# Patient Record
Sex: Female | Born: 1995 | Hispanic: Yes | Marital: Single | Smoking: Never smoker
Health system: Southern US, Community
[De-identification: ages and names within clinical notes are randomized; demographics above are authoritative.]

---

## 2020-04-19 ENCOUNTER — Other Ambulatory Visit: Payer: Self-pay

## 2020-04-19 ENCOUNTER — Encounter: Payer: Self-pay | Admitting: Emergency Medicine

## 2020-04-19 DIAGNOSIS — Z20822 Contact with and (suspected) exposure to covid-19: Secondary | ICD-10-CM | POA: Insufficient documentation

## 2020-04-19 DIAGNOSIS — N83201 Unspecified ovarian cyst, right side: Secondary | ICD-10-CM | POA: Insufficient documentation

## 2020-04-19 DIAGNOSIS — N83202 Unspecified ovarian cyst, left side: Secondary | ICD-10-CM | POA: Insufficient documentation

## 2020-04-19 LAB — CBC
HCT: 33.5 % — ABNORMAL LOW (ref 36.0–46.0)
Hemoglobin: 9.8 g/dL — ABNORMAL LOW (ref 12.0–15.0)
MCH: 22.2 pg — ABNORMAL LOW (ref 26.0–34.0)
MCHC: 29.3 g/dL — ABNORMAL LOW (ref 30.0–36.0)
MCV: 75.8 fL — ABNORMAL LOW (ref 80.0–100.0)
Platelets: 339 10*3/uL (ref 150–400)
RBC: 4.42 MIL/uL (ref 3.87–5.11)
RDW: 16.9 % — ABNORMAL HIGH (ref 11.5–15.5)
WBC: 6 10*3/uL (ref 4.0–10.5)
nRBC: 0 % (ref 0.0–0.2)

## 2020-04-19 LAB — URINALYSIS, COMPLETE (UACMP) WITH MICROSCOPIC
Bilirubin Urine: NEGATIVE
Glucose, UA: NEGATIVE mg/dL
Hgb urine dipstick: NEGATIVE
Ketones, ur: NEGATIVE mg/dL
Leukocytes,Ua: NEGATIVE
Nitrite: NEGATIVE
Protein, ur: NEGATIVE mg/dL
Specific Gravity, Urine: 1.01 (ref 1.005–1.030)
pH: 7 (ref 5.0–8.0)

## 2020-04-19 LAB — LIPASE, BLOOD: Lipase: 35 U/L (ref 11–51)

## 2020-04-19 LAB — COMPREHENSIVE METABOLIC PANEL
ALT: 50 U/L — ABNORMAL HIGH (ref 0–44)
AST: 32 U/L (ref 15–41)
Albumin: 4.3 g/dL (ref 3.5–5.0)
Alkaline Phosphatase: 72 U/L (ref 38–126)
Anion gap: 10 (ref 5–15)
BUN: 10 mg/dL (ref 6–20)
CO2: 26 mmol/L (ref 22–32)
Calcium: 8.6 mg/dL — ABNORMAL LOW (ref 8.9–10.3)
Chloride: 103 mmol/L (ref 98–111)
Creatinine, Ser: 0.58 mg/dL (ref 0.44–1.00)
GFR calc Af Amer: >60 mL/min (ref >60)
GFR calc non Af Amer: >60 mL/min (ref >60)
Glucose, Bld: 103 mg/dL — ABNORMAL HIGH (ref 70–99)
Potassium: 3.6 mmol/L (ref 3.5–5.1)
Sodium: 139 mmol/L (ref 135–145)
Total Bilirubin: 0.5 mg/dL (ref 0.3–1.2)
Total Protein: 7.6 g/dL (ref 6.5–8.1)

## 2020-04-19 LAB — POCT PREGNANCY, URINE: Preg Test, Ur: NEGATIVE

## 2020-04-19 NOTE — ED Triage Notes (Addendum)
Pt presents to ED via EMS with c/o sudden onset at 1830 of left lower abd pain rated at 10/10. Hx of ovarian cyst and states this feels the same. Denies pregnancy. EMS vs 99 temp 133/78 with a HR of 89. Denies vomiting. Diarrhea for the past week.

## 2020-04-20 ENCOUNTER — Emergency Department: Payer: Self-pay

## 2020-04-20 ENCOUNTER — Emergency Department
Admission: EM | Admit: 2020-04-20 | Discharge: 2020-04-20 | Disposition: A | Discharge status: Home / Self Care | Payer: Self-pay | Attending: Emergency Medicine | Admitting: Emergency Medicine

## 2020-04-20 DIAGNOSIS — R1032 Left lower quadrant pain: Secondary | ICD-10-CM

## 2020-04-20 DIAGNOSIS — N83201 Unspecified ovarian cyst, right side: Secondary | ICD-10-CM

## 2020-04-20 DIAGNOSIS — R1030 Lower abdominal pain, unspecified: Secondary | ICD-10-CM

## 2020-04-20 LAB — SARS CORONAVIRUS 2 BY RT PCR (HOSPITAL ORDER, PERFORMED IN ~~LOC~~ HOSPITAL LAB): SARS Coronavirus 2: NEGATIVE

## 2020-04-20 MED ORDER — KETOROLAC TROMETHAMINE 30 MG/ML IJ SOLN
30.0000 mg | Freq: Once | INTRAMUSCULAR | Status: AC
  Administered 2020-04-20: 30 mg via INTRAMUSCULAR
  Filled 2020-04-20: qty 1

## 2020-04-20 NOTE — Discharge Instructions (Addendum)
You have been seen in the Emergency Department (ED) for abdominal pain.  We believe your pain is mostly likely caused by ovarian cysts, which is generally a benign but potentially painful process.  Please read through the included information and follow up as instructed above regarding today's emergent visit and the symptoms that are bothering you.  Take over-the-counter ibuprofen and Tylenol as needed for pain control (unless your doctor has told you in the past to avoid either of these medications).  Return to the ED if your abdominal pain worsens or fails to improve, you develop bloody vomiting, bloody diarrhea, you are unable to tolerate fluids due to vomiting, fever greater than 101, or other symptoms that concern you.

## 2020-04-20 NOTE — ED Provider Notes (Signed)
[facility] Emergency Department Provider Note  ____________________________________________   First MD Initiated Contact with Patient 04/20/20 [phone]     (approximate)  I have reviewed the triage vital signs and the nursing notes.   HISTORY  Chief Complaint Abdominal Pain  The patient and/or family speak(s) Spanish natively although she does speak some English as well. She understands she has the right to the use of a hospital interpreter, however at this time she prefers to speak directly with me in [place] and [place]. She knows that she may request the use of a hospital interpreter at any time.   HPI Michelle Lindsey is a 24 y.o. female who reports a history of irregular menstrual periods and a prior issue with pain from ovarian cyst. She presents tonight for evaluation of severe pain in her left lower quadrant that she says has been occurring on and off for the last couple of days. She is about 2 weeks late on her menstrual cycle but that is not abnormal for her. She said when the pain occurs it is sharp and stabbing and very severe. She is also had some nausea and vomiting associated with the severe pain. Nothing in particular seems to make it better or worse. Is much better right now she is not actively hurting. She has no history of kidney stones and has had no burning when she urinates or other urinary symptoms. She denies fever/chills. She reports having some "lung pain" about 3 days ago and a mild sore throat. She is not vaccinated against COVID-19. She is having no difficulty breathing and no cough.          History reviewed. No pertinent past medical history.  There are no problems to display for this patient.   History reviewed. No pertinent surgical history.  Prior to Admission medications   Not on File    Allergies Patient has no known allergies.  No family history on file.  Social History Social History   Tobacco Use  . Smoking  status: Never Smoker  . Smokeless tobacco: Never Used  Vaping Use  . Vaping Use: Never used  Substance Use Topics  . Alcohol use: Yes  . Drug use: Not Currently    Review of Systems Constitutional: No fever/chills Eyes: No visual changes. ENT: Sore throat about 3 days ago. Cardiovascular: Denies chest pain. "Lung pain" 3 days ago, now resolved. Respiratory: Denies shortness of breath. Gastrointestinal: Left lower quadrant abdominal pain associated with nausea and vomiting as described above. Genitourinary: Negative for dysuria. Musculoskeletal: Negative for neck pain.  Negative for back pain. Integumentary: Negative for rash. Neurological: Negative for headaches, focal weakness or numbness.   ____________________________________________   PHYSICAL EXAM:  VITAL SIGNS: ED Triage Vitals  Enc Vitals Group     BP 04/19/20 1934 (!) 125/95     Pulse Rate 04/19/20 1934 72     Resp 04/19/20 1934 16     Temp 04/19/20 1934 99.3 F (37.4 C)     Temp Source 04/19/20 1934 Oral     SpO2 04/19/20 1934 100 %     Weight 04/19/20 1937 67.1 kg (148 lb)     Height 04/19/20 1937 1.448 m (4\' 9" )     Head Circumference --      Peak Flow --      Pain Score 04/19/20 1936 10     Pain Loc --      Pain Edu? --      Excl. in GC? --  Constitutional: Alert and oriented.  Eyes: Conjunctivae are normal.  Head: Atraumatic. Nose: No congestion/rhinnorhea. Mouth/Throat: Patient is wearing a mask. Neck: No stridor.  No meningeal signs.   Cardiovascular: Normal rate, regular rhythm. Good peripheral circulation. Grossly normal heart sounds. Respiratory: Normal respiratory effort.  No retractions. Gastrointestinal: Soft and nondistended. Mild generalized tenderness to palpation throughout the abdomen but most notable in the left lower quadrant. Genitourinary: Deferred Musculoskeletal: No lower extremity tenderness nor edema. No gross deformities of extremities. Neurologic:  Normal speech and  language. No gross focal neurologic deficits are appreciated.  Skin:  Skin is warm, dry and intact. Psychiatric: Mood and affect are normal. Speech and behavior are normal.  ____________________________________________   LABS (all labs ordered are listed, but only abnormal results are displayed)  Labs Reviewed  COMPREHENSIVE METABOLIC PANEL - Abnormal; Notable for the following components:      Result Value   Glucose, Bld 103 (*)    Calcium 8.6 (*)    ALT 50 (*)    All other components within normal limits  CBC - Abnormal; Notable for the following components:   Hemoglobin 9.8 (*)    HCT 33.5 (*)    MCV 75.8 (*)    MCH 22.2 (*)    MCHC 29.3 (*)    RDW 16.9 (*)    All other components within normal limits  URINALYSIS, COMPLETE (UACMP) WITH MICROSCOPIC - Abnormal; Notable for the following components:   Color, Urine YELLOW (*)    APPearance CLOUDY (*)    Bacteria, UA RARE (*)    All other components within normal limits  SARS CORONAVIRUS 2 BY RT PCR (HOSPITAL ORDER, PERFORMED IN [facility])  LIPASE, BLOOD  POC URINE PREG, ED  POCT PREGNANCY, URINE   ____________________________________________  EKG  None - EKG not ordered by ED physician ____________________________________________  RADIOLOGY Marylou MccoyI, Jaislyn Blinn, personally viewed and evaluated these images (plain radiographs) as part of my medical decision making, as well as reviewing the written report by the radiologist.  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  No evidence of torsion or other emergent medical condition.  Official radiology report(s): US PELVIC COMPLETE W TRANSVAGINAL AND TORSION R/O  Result Date: 04/20/2020 CLINICAL DATA:  Initial evaluation for severe intermittent left lower quadrant pain. EXAM: TRANSABDOMINAL AND TRANSVAGINAL ULTRASOUND OF PELVIS DOPPLER ULTRASOUND OF OVARIES TECHNIQUE: Both transabdominal and transvaginal ultrasound  examinations of the pelvis were performed. Transabdominal technique was performed for global imaging of the pelvis including uterus, ovaries, adnexal regions, and pelvic cul-de-sac. It was necessary to proceed with endovaginal exam following the transabdominal exam to visualize the uterus, endometrium, and ovaries. Color and duplex Doppler ultrasound was utilized to evaluate blood flow to the ovaries. COMPARISON:  None. FINDINGS: Uterus Measurements: 7.7 x 3.0 x 4.8 cm = volume: 58 mL. Uterus is anteverted. No fibroids or other mass visualized. Endometrium Thickness: 5 mm.  No focal abnormality visualized. Right ovary Measurements: 3.8 x 3.4 x 4.9 cm = volume: 50 mL. Heterogeneous hyperechoic lesion likely arising from the right ovary measures 3.3 x 2.8 x 3.2 cm. Findings suggestive of an ovarian dermoid. Left ovary Measurements: 7.8 x 5.0 x 6.7 cm = volume: 137 mL. An additional heterogeneous hyperechoic lesion position within the left adnexa measures 5.7 x 4.3 x 5.1 cm, also suggestive of an ovarian dermoid. Pulsed Doppler evaluation of both ovaries demonstrates normal low-resistance arterial and venous waveforms. Other findings No abnormal free fluid. IMPRESSION: 1. Bilateral heterogeneous  hyperechoic ovarian masses, measuring up to 3.3 cm on the right and 5.7 cm on the left, most suggestive of ovarian dermoids. Further assessment with dedicated pelvic MRI could be performed for further evaluation and confirmatory purposes as warranted. 2. No evidence for ovarian torsion. 3. Normal sonographic appearance of the uterus and endometrium. No other acute abnormality within the pelvis. Electronically Signed   By: Rise Mu M.D.   On: 04/20/2020 04:33    ____________________________________________   PROCEDURES   Procedure(s) performed (including Critical Care):  Procedures   ____________________________________________   INITIAL IMPRESSION / MDM / ASSESSMENT AND PLAN / ED COURSE  As part  of my medical decision making, I reviewed the following data within the electronic MEDICAL RECORD NUMBER Nursing notes reviewed and incorporated, Labs reviewed , Old chart reviewed and Notes from prior ED visits   Differential diagnosis includes, but is not limited to, ovarian cyst, ovarian torsion, STD/PID/TOA, UTI/pyonephritis, renal/ureteral colic.  The patient has irregular menstrual periods and had acute onset of pain that is suggestive of ovarian pain or renal colic. Her urinalysis is unremarkable. She has tenderness palpation of the abdomen that is relatively mild and her pain is almost completely gone away. The episodic nature of it is suggestive to me of an adnexal issue. She reports that she has some nausea and vomiting associated with severe pain and I think it would be beneficial to obtain an ultrasound to rule out ovarian torsion and any particular large cyst. She reports that in the past she was told she had a 5 cm cyst and that she was supposed to have surgery but she never had it done. I do not think she would benefit from a CT scan of the abdomen and pelvis at this time. She is also reporting no other vaginal or urinary symptoms and I think that STD is significantly less likely.  Comprehensive metabolic panel is essentially normal although she has a very slightly elevated ALT which is just above the upper limit of normal. As previously mentioned her urinalysis is unremarkable and her urine pregnancy test is negative. She understands and agrees with the plan for ultrasound.  Also she is describing some vague symptoms that could represent a viral infection such as COVID-19. She is unvaccinated and I have ordered a nasal swab PCR test for COVID-19. She understands as well but I think this is unlikely. However if we are able to rule it out that should be a relief to her, and if it is positive he will be important for her to know for purpose of ongoing care and isolation/quarantine for her  family.         Clinical Course as of Apr 20 752  Thu Apr 20, 2020  0456 SARS Coronavirus 2: NEGATIVE [CF]  [phone] Patient's ultrasound shows bilateral cystic structures on her ovaries that appear to be dermoid in nature but without any evidence of torsion.  The patient has been resting and sleeping comfortably.  She says she is not at all concerned about the possibility of STD.  She is comfortable with the plan for outpatient follow-up with GYN and I gave her my usual and customary return precautions.  Toradol 30 mg intramuscular injection prior to discharge.   [CF]    Clinical Course User Index [CF] Loleta Rose, MD     ____________________________________________  FINAL CLINICAL IMPRESSION(S) / ED DIAGNOSES  Final diagnoses:  Lower abdominal pain  Bilateral ovarian cysts     MEDICATIONS GIVEN DURING THIS VISIT:  Medications  ketorolac (TORADOL) 30 MG/ML injection 30 mg (30 mg Intramuscular Given 04/20/20 0865)     ED Discharge Orders    None      *Please note:  Michelle Lindsey was evaluated in Emergency Department on 04/20/2020 for the symptoms described in the history of present illness. She was evaluated in the context of the global COVID-19 pandemic, which necessitated consideration that the patient might be at risk for infection with the SARS-CoV-2 virus that causes COVID-19. Institutional protocols and algorithms that pertain to the evaluation of patients at risk for COVID-19 are in a state of rapid change based on information released by regulatory bodies including the CDC and federal and state organizations. These policies and algorithms were followed during the patient's care in the ED.  Some ED evaluations and interventions may be delayed as a result of limited staffing during and after the pandemic.*  Note:  This document was prepared using Dragon voice recognition software and may include unintentional dictation errors.   Loleta Rose, MD 04/20/20 [phone]

## 2021-10-01 IMAGING — US US PELVIS COMPLETE TRANSABD/TRANSVAG W DUPLEX
1 series · 13 of 25 positions shown · non-contrast
Comparison: None.

CLINICAL DATA: Initial evaluation for severe intermittent left
lower quadrant pain.

EXAM:
TRANSABDOMINAL AND TRANSVAGINAL ULTRASOUND OF PELVIS
DOPPLER ULTRASOUND OF OVARIES
TECHNIQUE: Both transabdominal and transvaginal ultrasound examinations of the
pelvis were performed. Transabdominal technique was performed for
global imaging of the pelvis including uterus, ovaries, adnexal
regions, and pelvic cul-de-sac.
It was necessary to proceed with endovaginal exam following the
transabdominal exam to visualize the uterus, endometrium, and
ovaries. Color and duplex Doppler ultrasound was utilized to
evaluate blood flow to the ovaries.

[Series 1: us pelvis (transabdominal only) · 114 acquisitions, 13 frames shown]
[im 1/114]
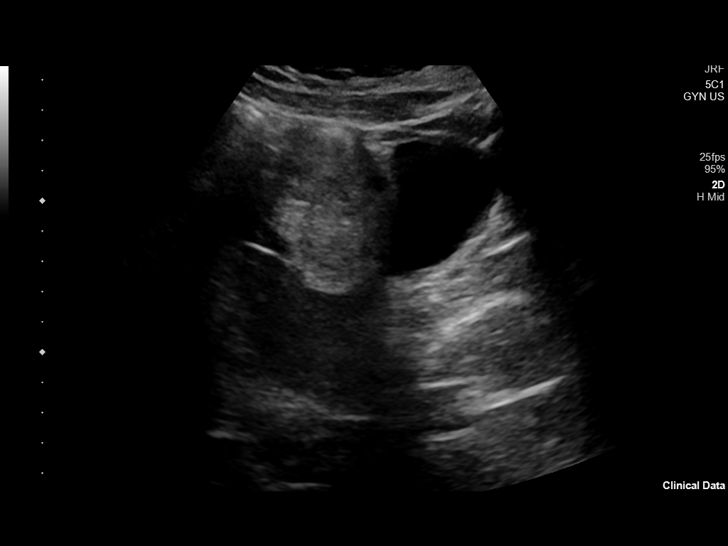
[im 10/114]
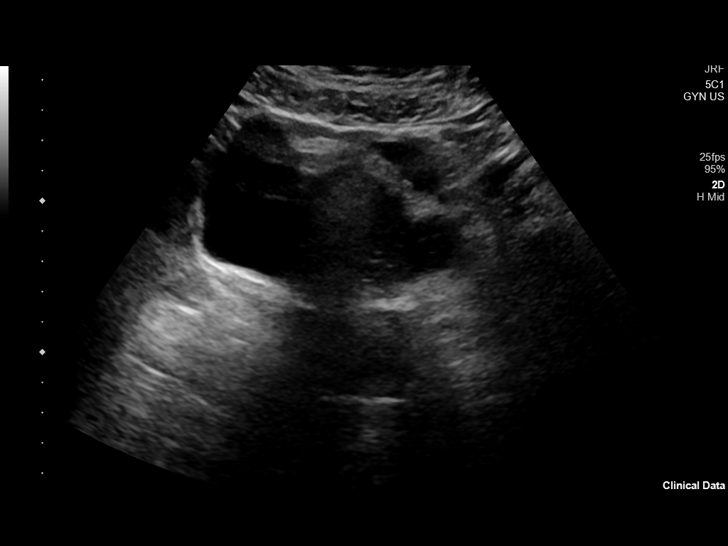
[im 19/114]
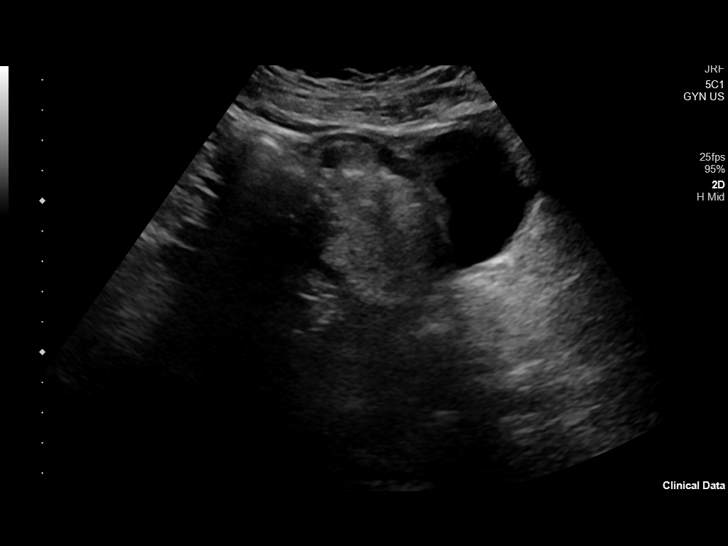
[im 29/114]
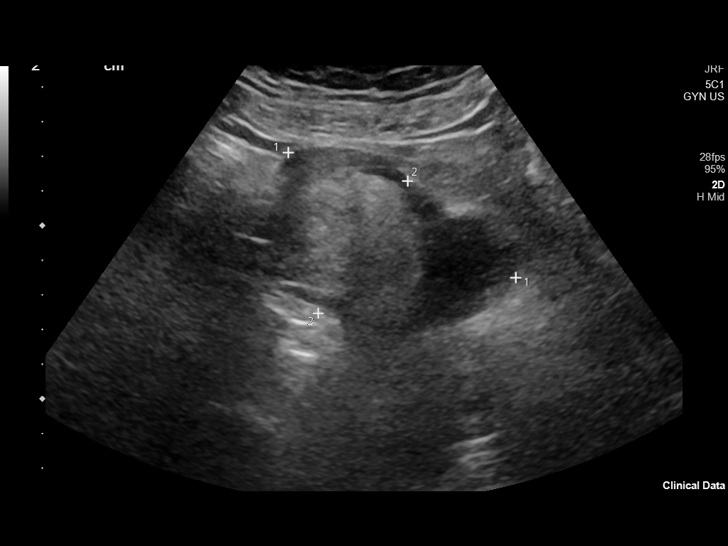
[im 38/114]
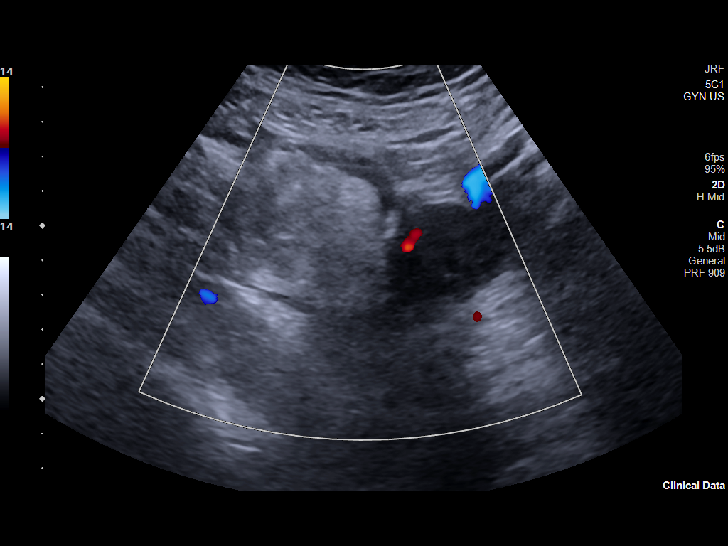
[im 48/114]
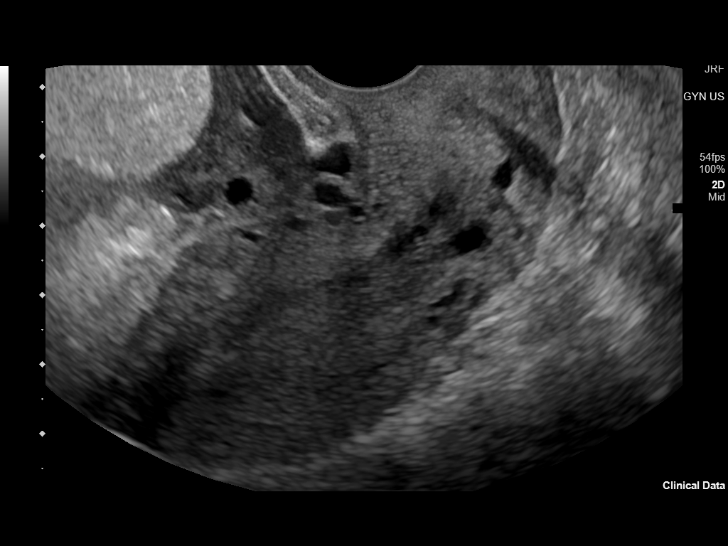
[im 57/114]
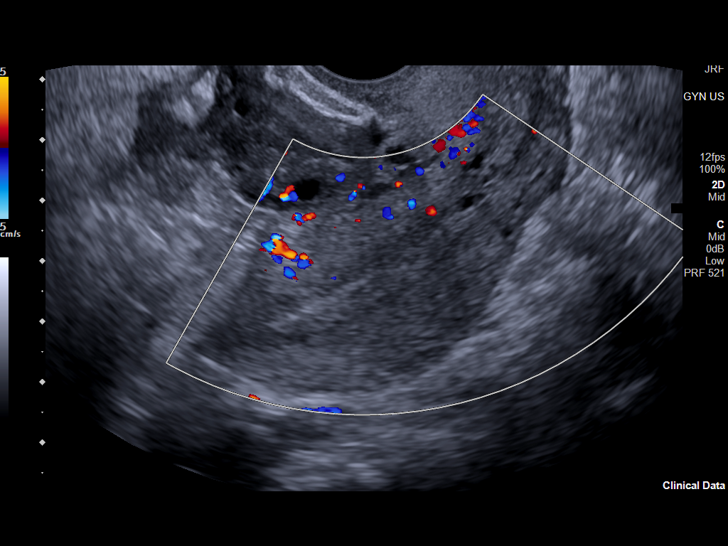
[im 66/114]
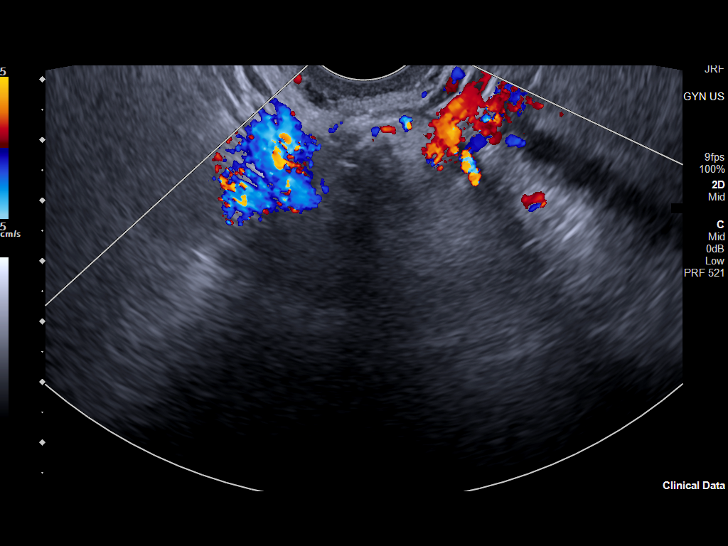
[im 76/114]
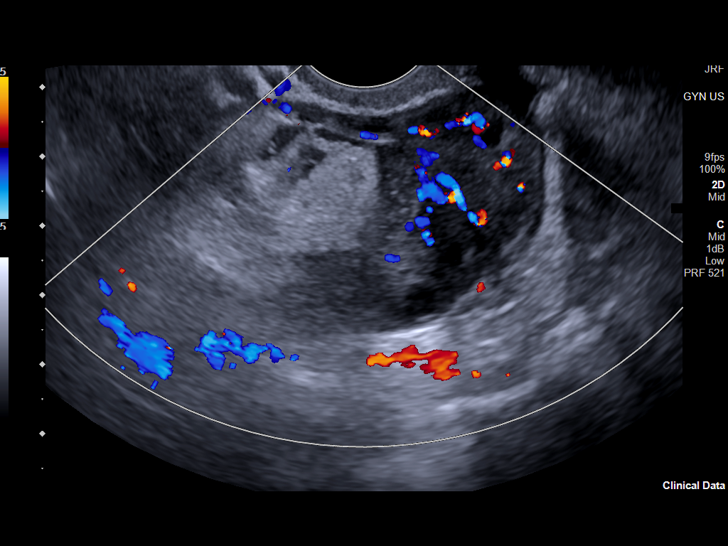
[im 85/114]
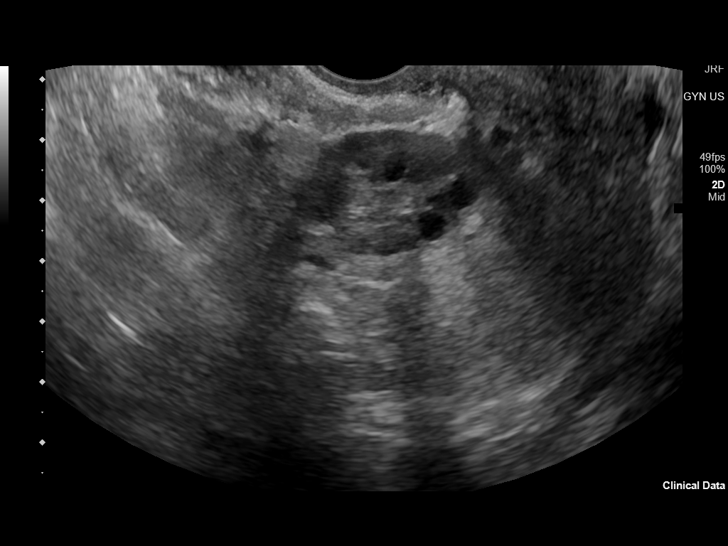
[im 95/114]
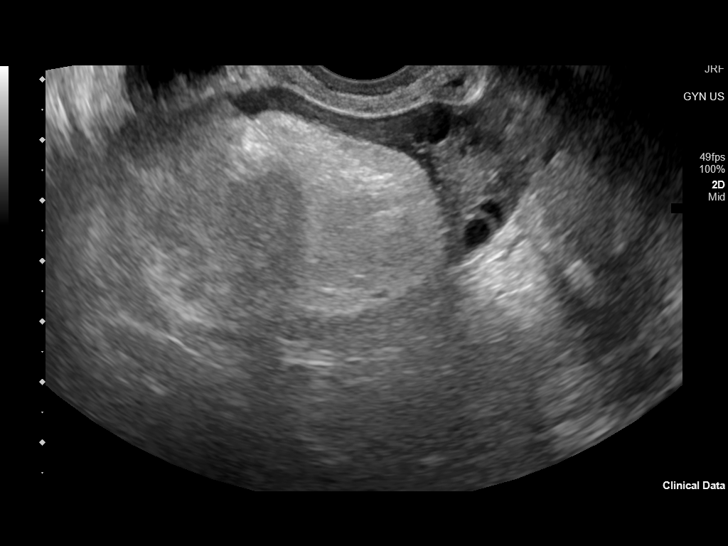
[im 104/114]
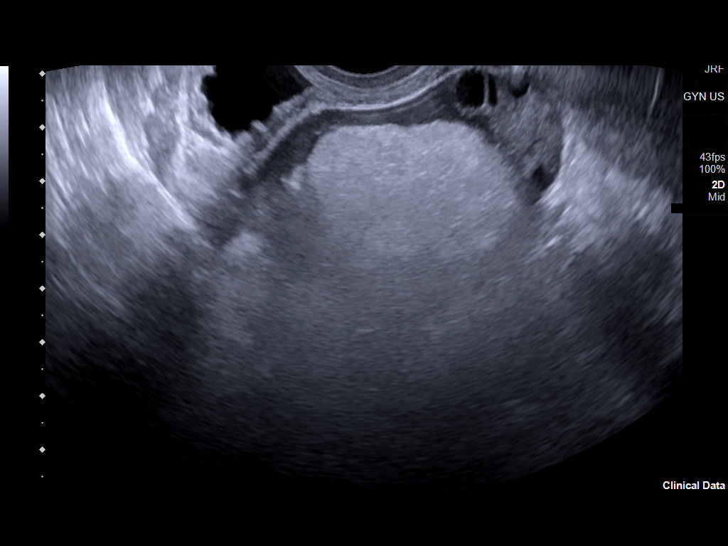
[im 114/114]
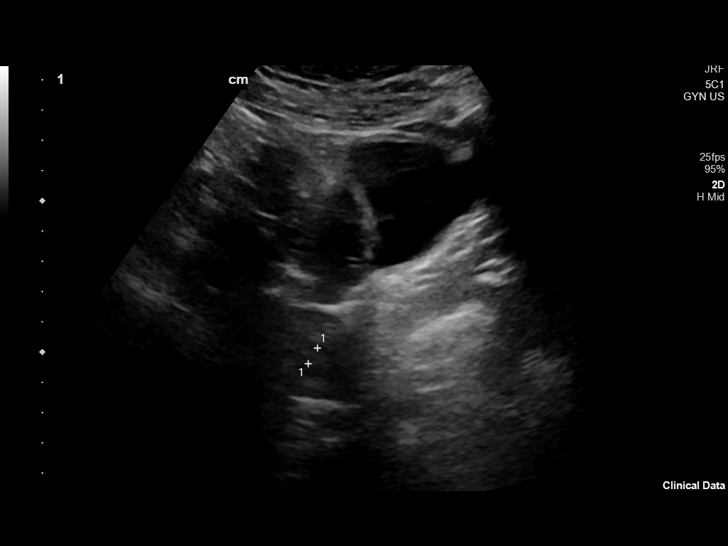

[13 of 25 positions shown; findings below may reference images not displayed]

FINDINGS: Uterus

Measurements: 7.7 x 3.0 x 4.8 cm = volume: 58 mL. Uterus is
anteverted. No fibroids or other mass visualized.

Endometrium

Thickness: 5 mm.  No focal abnormality visualized.

Right ovary

Measurements: 3.8 x 3.4 x 4.9 cm = volume: 50 mL. Heterogeneous
hyperechoic lesion likely arising from the right ovary measures
x 2.8 x 3.2 cm. Findings suggestive of an ovarian dermoid.

Left ovary

Measurements: 7.8 x 5.0 x 6.7 cm = volume: 137 mL. An additional
heterogeneous hyperechoic lesion position within the left adnexa
measures 5.7 x 4.3 x 5.1 cm, also suggestive of an ovarian dermoid.

Pulsed Doppler evaluation of both ovaries demonstrates normal
low-resistance arterial and venous waveforms.

Other findings

No abnormal free fluid.
IMPRESSION: 1. Bilateral heterogeneous hyperechoic ovarian masses, measuring up
to 3.3 cm on the right and 5.7 cm on the left, most suggestive of
ovarian dermoids. Further assessment with dedicated pelvic MRI could
be performed for further evaluation and confirmatory purposes as
warranted.
2. No evidence for ovarian torsion.
3. Normal sonographic appearance of the uterus and endometrium. No
other acute abnormality within the pelvis.
